# Patient Record
Sex: Female | Born: 1980 | Race: White | Hispanic: No | Marital: Married | Smoking: Current every day smoker
Health system: Southern US, Community
[De-identification: ages and names within clinical notes are randomized; demographics above are authoritative.]

## PROBLEM LIST (undated history)

## (undated) HISTORY — PX: FRACTURE SURGERY: SHX138

---

## 2009-02-28 ENCOUNTER — Emergency Department: Payer: Self-pay | Admitting: Emergency Medicine

## 2009-04-02 ENCOUNTER — Emergency Department: Payer: Self-pay | Admitting: Emergency Medicine

## 2009-09-10 ENCOUNTER — Emergency Department: Payer: Self-pay | Admitting: Emergency Medicine

## 2010-04-08 ENCOUNTER — Emergency Department: Payer: Self-pay | Admitting: Unknown Physician Specialty

## 2010-04-16 ENCOUNTER — Emergency Department: Payer: Self-pay | Admitting: Emergency Medicine

## 2010-06-11 ENCOUNTER — Emergency Department: Payer: Self-pay | Admitting: Unknown Physician Specialty

## 2010-11-16 ENCOUNTER — Emergency Department: Payer: Self-pay | Admitting: Unknown Physician Specialty

## 2012-02-06 IMAGING — CR DG LUMBAR SPINE 2-3V
1 series · 3 of 3 positions shown · non-contrast
Comparison: none

REASON FOR EXAM: back pain
COMMENTS:

PROCEDURE:     DXR - DXR LUMBAR SPINE AP AND LATERAL  - April 08, 2010  [DATE]
RESULT:     No acute bony or joint abnormality. The pedicles are intact.

[Series 1: view not recorded · 0.17mm/px · 3 of 3 slices shown]
[im 1/3]
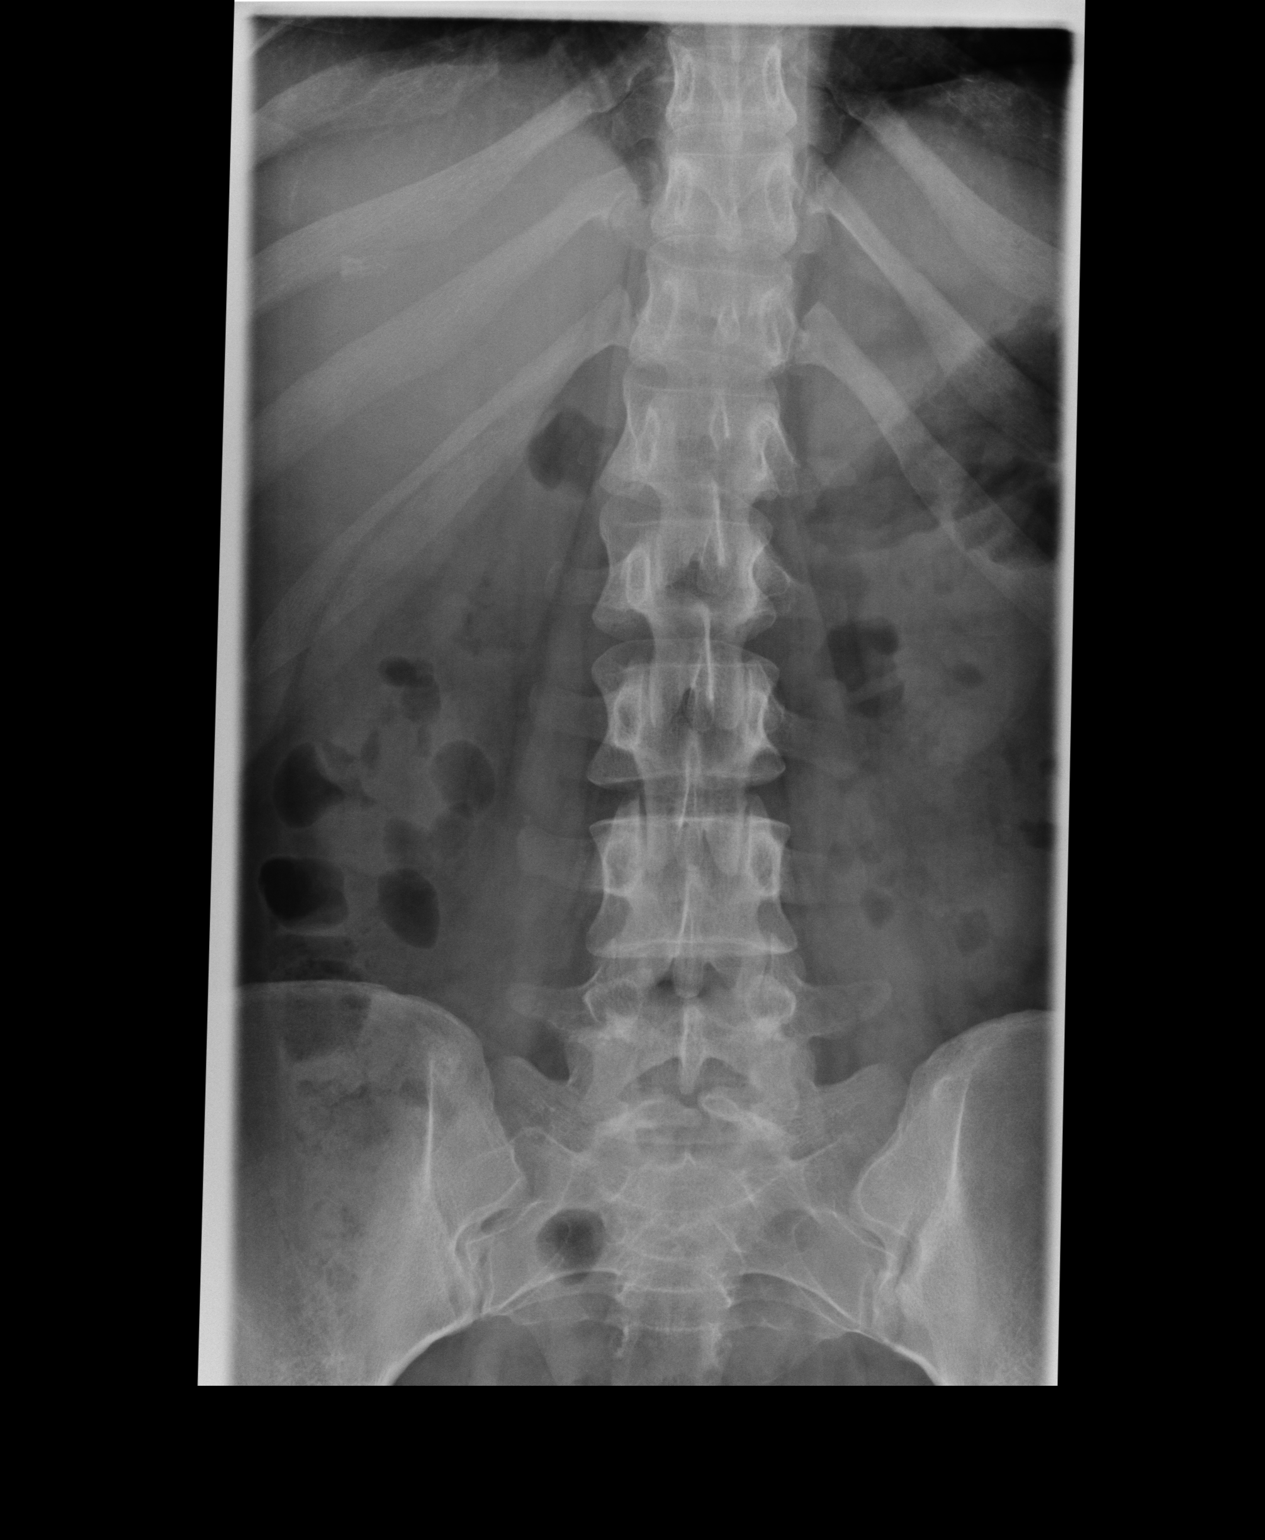
[im 2/3]
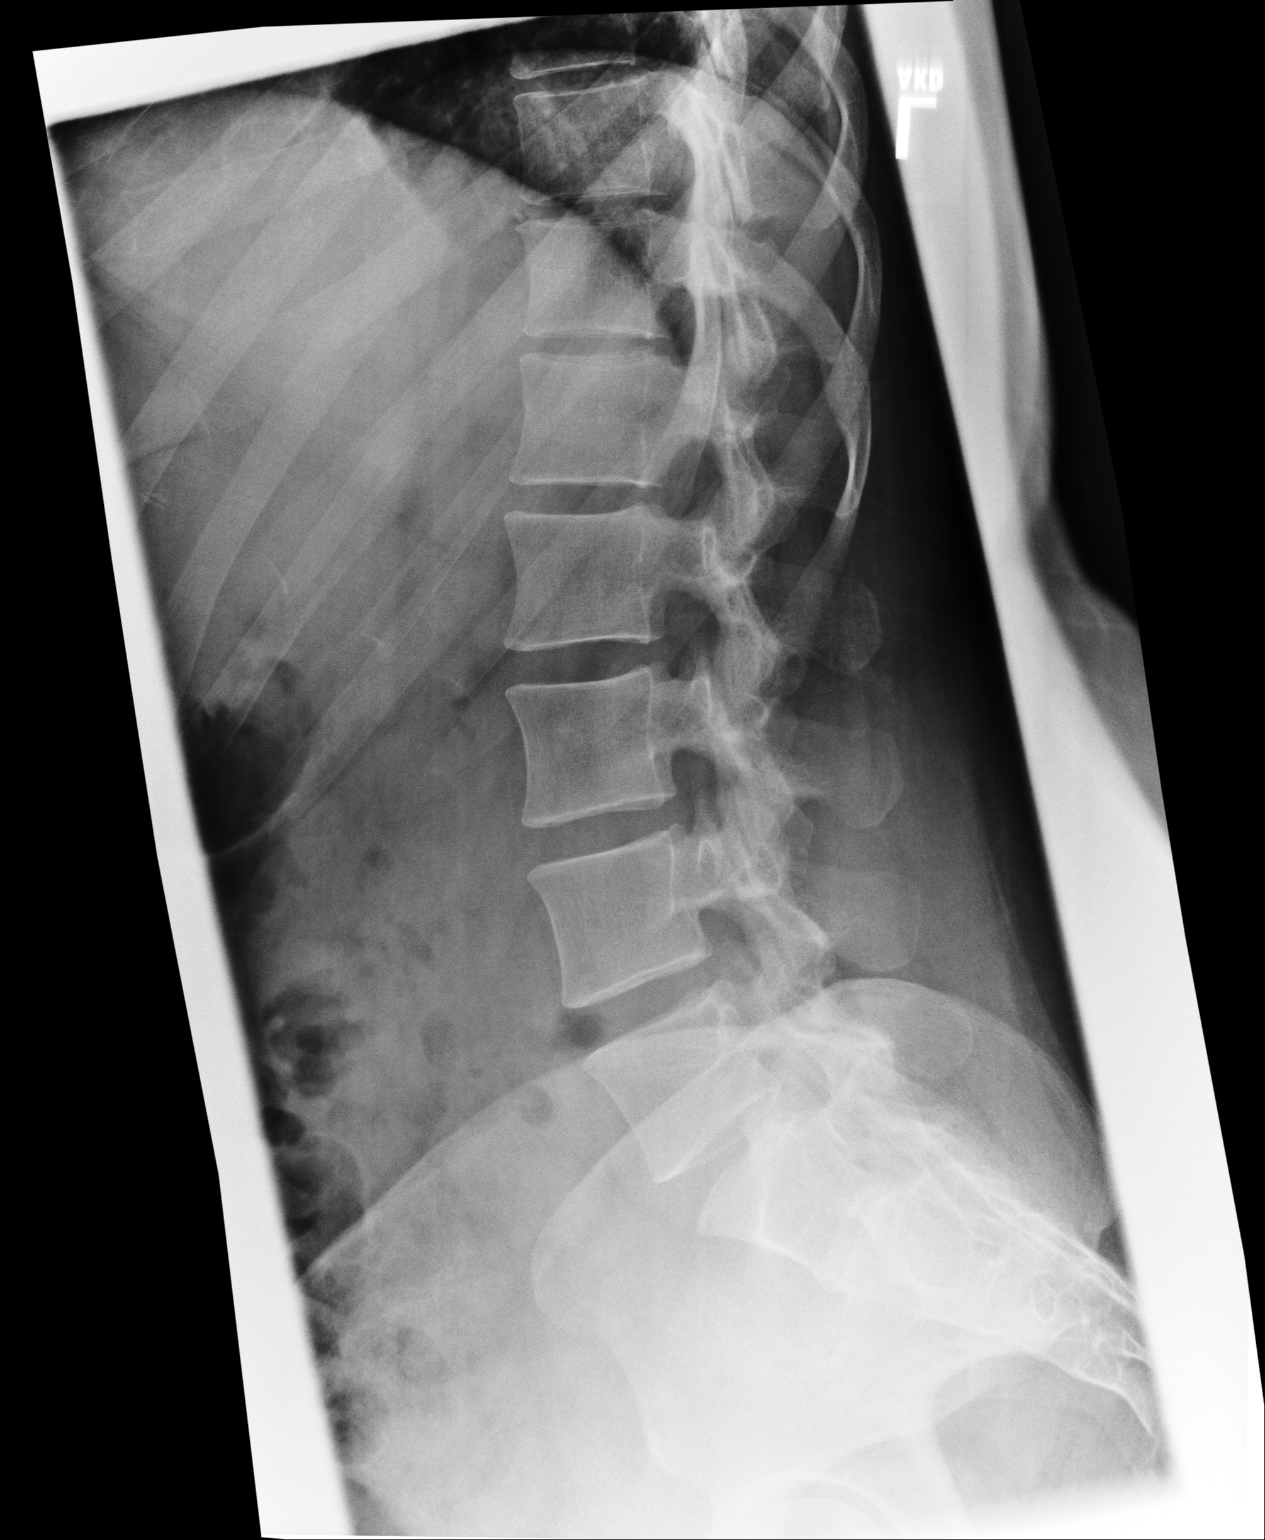
[im 3/3]
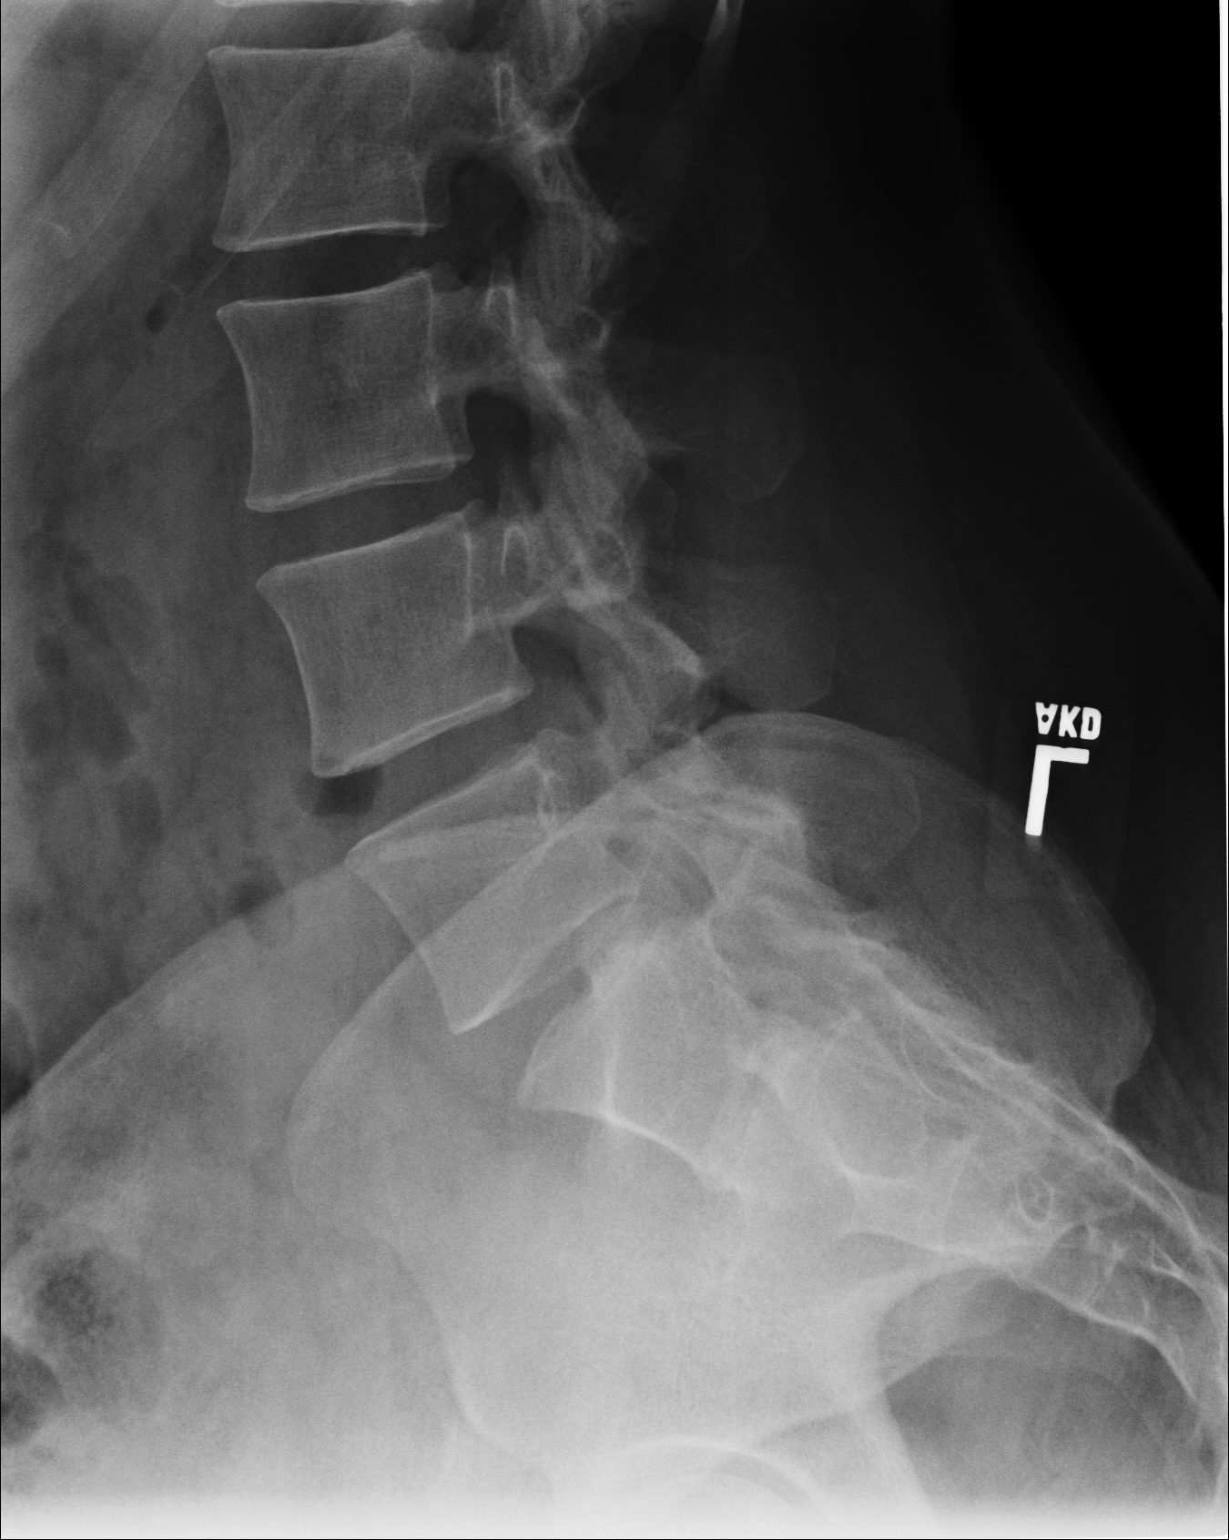

[3 of 3 positions shown; findings below may reference images not displayed]

IMPRESSION: No acute abnormality.
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
## 2016-11-28 ENCOUNTER — Emergency Department
Admission: EM | Admit: 2016-11-28 | Discharge: 2016-11-28 | Disposition: A | Discharge status: Home / Self Care | Payer: Self-pay | Attending: Emergency Medicine | Admitting: Emergency Medicine

## 2016-11-28 ENCOUNTER — Emergency Department: Payer: Self-pay

## 2016-11-28 ENCOUNTER — Encounter: Payer: Self-pay | Admitting: Emergency Medicine

## 2016-11-28 DIAGNOSIS — W230XXA Caught, crushed, jammed, or pinched between moving objects, initial encounter: Secondary | ICD-10-CM | POA: Insufficient documentation

## 2016-11-28 DIAGNOSIS — Y939 Activity, unspecified: Secondary | ICD-10-CM | POA: Insufficient documentation

## 2016-11-28 DIAGNOSIS — S60221A Contusion of right hand, initial encounter: Secondary | ICD-10-CM | POA: Insufficient documentation

## 2016-11-28 DIAGNOSIS — Y929 Unspecified place or not applicable: Secondary | ICD-10-CM | POA: Insufficient documentation

## 2016-11-28 DIAGNOSIS — Y999 Unspecified external cause status: Secondary | ICD-10-CM | POA: Insufficient documentation

## 2016-11-28 MED ORDER — NABUMETONE 750 MG PO TABS: 750.0000 mg | ORAL_TABLET | Freq: Two times a day (BID) | ORAL | 0 refills | Status: AC

## 2016-11-28 MED ORDER — HYDROCODONE-ACETAMINOPHEN 5-325 MG PO TABS: 1.0000 | ORAL_TABLET | Freq: Four times a day (QID) | ORAL | 0 refills | Status: AC | PRN

## 2016-11-28 NOTE — ED Triage Notes (Signed)
Patient states she "slammed my hand in my car door"  Approx. 2 hours ago, accidentally.  Patient's right hand appears bruised and swollen.  Sensation and mobility intact in right fingers.

## 2016-11-28 NOTE — Discharge Instructions (Signed)
Take the prescription meds as directed. Apply ice compresses to promote healing. Follow-up with your provider as needed, [facility].

## 2016-11-28 NOTE — ED Provider Notes (Signed)
[facility] Emergency Department Provider Note ____________________________________________  Time seen: 1706  I have reviewed the triage vital signs and the nursing notes.  HISTORY  Chief Complaint  Hand Pain  HPI Mary Robbins is a 36 y.o.right-handed female who presents to the ED for evaluation of pain to the right hand.  Patient describes she accidentally slammed her right hand into the car door about 2 hours prior to arrival.  She presents now with bruising and swelling over the dorsal aspect of her hand.  She also reports some pain with manipulation.  She denies any other injury at this time, denies any laceration or nailbed injury.  She is dose ibuprofen in the interim without significant benefit.  History reviewed. No pertinent past medical history.  There are no active problems to display for this patient.  History reviewed. No pertinent surgical history.  Prior to Admission medications   Medication Sig Start Date End Date Taking? Authorizing Provider  HYDROcodone-acetaminophen (NORCO) 5-325 MG tablet Take 1 tablet every 6 (six) hours as needed by mouth. 11/28/16   Cyera Balboni, Charlesetta IvoryJenise V Bacon, PA-C  nabumetone (RELAFEN) 750 MG tablet Take 1 tablet (750 mg total) 2 (two) times daily by mouth. 11/28/16   Burech Mcfarland, Charlesetta IvoryJenise V Bacon, PA-C   Allergies Codeine  No family history on file.  Social History Social History   Tobacco Use  . Smoking status: Never Smoker  . Smokeless tobacco: Never Used  Substance Use Topics  . Alcohol use: No    Frequency: Never  . Drug use: No    Review of Systems  Constitutional: Negative for fever. Cardiovascular: Negative for chest pain. Respiratory: Negative for shortness of breath. Gastrointestinal: Negative for abdominal pain, vomiting and diarrhea. Musculoskeletal: Negative for back pain.  Right hand pain as above. Skin: Negative for rash. Neurological: Negative for headaches, focal weakness or  numbness. ____________________________________________  PHYSICAL EXAM:  VITAL SIGNS: ED Triage Vitals  Enc Vitals Group     BP 11/28/16 1612 121/90     Pulse Rate 11/28/16 1612 (!) 110     Resp 11/28/16 1612 16     Temp 11/28/16 1612 98.3 F (36.8 C)     Temp Source 11/28/16 1612 Oral     SpO2 11/28/16 1612 100 %     Weight 11/28/16 1613 187 lb (84.8 kg)     Height 11/28/16 1613 5' 5.5" (1.664 m)     Head Circumference --      Peak Flow --      Pain Score 11/28/16 1611 9     Pain Loc --      Pain Edu? --      Excl. in GC? --     Constitutional: Alert and oriented. Well appearing and in no distress.  Patient able to hold and manipulate her cell phone and both hands (thumbs) upon entering the room. Head: Normocephalic and atraumatic. Neck: Supple. No thyromegaly. Cardiovascular: Normal rate, regular rhythm. Normal distal pulses and cap refill Respiratory: Normal respiratory effort. No wheezes/rales/rhonchi. Musculoskeletal: Right hand without any obvious deformity, dislocation, or laceration.  She is noted to have some dorsal soft tissue swelling with superficial hematoma noted over the dorsal second and third MCPs.  Normal composite fist on exam.  Nontender with normal range of motion in all extremities.  Neurologic: Cranial nerves II through XII grossly intact.  Normal gross sensation.  Normal oppotition and intrinsic testing.  Normal gait without ataxia. Normal speech and language. No gross focal neurologic deficits are appreciated.  Skin:  Skin is warm, dry and intact. No rash noted. Intact as noted above. ____________________________________________   RADIOLOGY  Right Hand  IMPRESSION: No bone abnormality.  Soft tissue swelling as described  I, Estelle Skibicki, Charlesetta IvoryJenise V Bacon, personally viewed and evaluated these images (plain radiographs) as part of my medical decision making, as well as reviewing the written report by the  radiologist. ____________________________________________  INITIAL IMPRESSION / ASSESSMENT AND PLAN / ED COURSE  Patient with ED evaluation of injury to the right hand after an accidental, self-inflicted crush injury.  Patient excellently slammed her hand in the car door.  She presents now with pain, swelling, and focal hematoma to the dorsal aspect of the hand.  Her x-ray is negative for any acute fracture or dislocation.  Patient with normal composite fist and normal hand exam.  She has declined a Ace bandage or wrist cock-up splint at this time.  She is discharged with prescription for Relafen and #10 hydrocodone for breakthrough pain.  She will follow-up with medicine urgent care for ongoing symptoms.  A work note is provided for 1 day as requested. ____________________________________________  FINAL CLINICAL IMPRESSION(S) / ED DIAGNOSES  Final diagnoses:  Contusion of right hand, initial encounter      Lissa HoardMenshew, Savvy Peeters V Bacon, PA-C 11/28/16 1755    Sharman CheekStafford, Phillip, MD 11/28/16 2106

## 2017-05-17 ENCOUNTER — Emergency Department
Admission: EM | Admit: 2017-05-17 | Discharge: 2017-05-17 | Disposition: A | Discharge status: Nursing Home (Includes ICF) | Payer: Self-pay | Attending: Emergency Medicine | Admitting: Emergency Medicine

## 2017-05-17 ENCOUNTER — Other Ambulatory Visit: Payer: Self-pay

## 2017-05-17 ENCOUNTER — Emergency Department: Payer: Self-pay

## 2017-05-17 DIAGNOSIS — Y92009 Unspecified place in unspecified non-institutional (private) residence as the place of occurrence of the external cause: Secondary | ICD-10-CM | POA: Insufficient documentation

## 2017-05-17 DIAGNOSIS — Z79899 Other long term (current) drug therapy: Secondary | ICD-10-CM | POA: Insufficient documentation

## 2017-05-17 DIAGNOSIS — S0230XA Fracture of orbital floor, unspecified side, initial encounter for closed fracture: Secondary | ICD-10-CM

## 2017-05-17 DIAGNOSIS — S0232XA Fracture of orbital floor, left side, initial encounter for closed fracture: Secondary | ICD-10-CM | POA: Insufficient documentation

## 2017-05-17 DIAGNOSIS — S0990XA Unspecified injury of head, initial encounter: Secondary | ICD-10-CM

## 2017-05-17 DIAGNOSIS — Y9389 Activity, other specified: Secondary | ICD-10-CM | POA: Insufficient documentation

## 2017-05-17 DIAGNOSIS — Y999 Unspecified external cause status: Secondary | ICD-10-CM | POA: Insufficient documentation

## 2017-05-17 MED ORDER — ACETAMINOPHEN 500 MG PO TABS
1000.0000 mg | ORAL_TABLET | Freq: Once | ORAL | Status: AC
  Administered 2017-05-17: 1000 mg via ORAL
  Filled 2017-05-17: qty 2

## 2017-05-17 MED ORDER — ONDANSETRON 4 MG PO TBDP
4.0000 mg | ORAL_TABLET | Freq: Once | ORAL | Status: AC | PRN
  Administered 2017-05-17: 4 mg via ORAL
  Filled 2017-05-17: qty 1
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
## 2017-05-17 NOTE — ED Notes (Signed)
Pt offered a percocet but denies wanting to take a pain pill at this time. Was offered zofran for nausea and states she will take that. States she took ibuprofen earlier.
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
## 2017-05-17 NOTE — ED Notes (Signed)
emtala reviewed by this RN 
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
## 2017-05-17 NOTE — ED Notes (Signed)
Informed Dr. Mayford Knife of pt presentation, verbal orders received for CT scans and pain medicine.

## 2017-05-17 NOTE — ED Notes (Addendum)
Dr. Brooke Dare in room, using portable slit lamp.
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
## 2017-05-17 NOTE — ED Notes (Signed)
Pt took small sip of water with Tylenol

## 2017-05-17 NOTE — ED Notes (Signed)
Pt on the phone, tearful at this time, Respirations equal and unlabored at this time.
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
## 2017-11-17 ENCOUNTER — Emergency Department
Admission: EM | Admit: 2017-11-17 | Discharge: 2017-11-17 | Disposition: A | Discharge status: Home / Self Care | Payer: Self-pay | Attending: Emergency Medicine | Admitting: Emergency Medicine

## 2017-11-17 ENCOUNTER — Other Ambulatory Visit: Payer: Self-pay

## 2017-11-17 ENCOUNTER — Encounter: Payer: Self-pay | Admitting: Emergency Medicine

## 2017-11-17 DIAGNOSIS — R079 Chest pain, unspecified: Secondary | ICD-10-CM | POA: Insufficient documentation

## 2017-11-17 DIAGNOSIS — R51 Headache: Secondary | ICD-10-CM | POA: Insufficient documentation

## 2017-11-17 DIAGNOSIS — R109 Unspecified abdominal pain: Secondary | ICD-10-CM | POA: Insufficient documentation

## 2017-11-17 DIAGNOSIS — R112 Nausea with vomiting, unspecified: Secondary | ICD-10-CM | POA: Insufficient documentation

## 2017-11-17 LAB — COMPREHENSIVE METABOLIC PANEL
ALT: 20 U/L (ref 0–44)
AST: 21 U/L (ref 15–41)
Albumin: 4.6 g/dL (ref 3.5–5.0)
Alkaline Phosphatase: 84 U/L (ref 38–126)
Anion gap: 9 (ref 5–15)
BUN: 6 mg/dL (ref 6–20)
CHLORIDE: 103 mmol/L (ref 98–111)
CO2: 29 mmol/L (ref 22–32)
CREATININE: 0.65 mg/dL (ref 0.44–1.00)
Calcium: 9.4 mg/dL (ref 8.9–10.3)
Glucose, Bld: 83 mg/dL (ref 70–99)
Potassium: 3.9 mmol/L (ref 3.5–5.1)
Sodium: 141 mmol/L (ref 135–145)
TOTAL PROTEIN: 7.6 g/dL (ref 6.5–8.1)
Total Bilirubin: 0.7 mg/dL (ref 0.3–1.2)

## 2017-11-17 LAB — CBC
HCT: 44.8 % (ref 36.0–46.0)
Hemoglobin: 14.5 g/dL (ref 12.0–15.0)
MCH: 28.9 pg (ref 26.0–34.0)
MCHC: 32.4 g/dL (ref 30.0–36.0)
MCV: 89.4 fL (ref 80.0–100.0)
PLATELETS: 355 10*3/uL (ref 150–400)
RBC: 5.01 MIL/uL (ref 3.87–5.11)
RDW: 13.3 % (ref 11.5–15.5)
WBC: 12.3 10*3/uL — ABNORMAL HIGH (ref 4.0–10.5)
nRBC: 0 % (ref 0.0–0.2)

## 2017-11-17 LAB — TROPONIN I: Troponin I: <0.03 ng/mL (ref <0.03)

## 2017-11-17 LAB — LIPASE, BLOOD: LIPASE: 30 U/L (ref 11–51)

## 2017-11-17 MED ORDER — ONDANSETRON HCL 4 MG/2ML IJ SOLN: 4.0000 mg | Freq: Once | INTRAMUSCULAR | Status: DC

## 2017-11-17 MED ORDER — ONDANSETRON 4 MG PO TBDP: 4.0000 mg | ORAL_TABLET | Freq: Three times a day (TID) | ORAL | 0 refills | Status: AC | PRN

## 2017-11-17 MED ORDER — METOCLOPRAMIDE HCL 5 MG/ML IJ SOLN: 10.0000 mg | Freq: Once | INTRAMUSCULAR | Status: DC

## 2017-11-17 MED ORDER — SODIUM CHLORIDE 0.9 % IV SOLN: Freq: Once | INTRAVENOUS | Status: DC

## 2017-11-17 MED ORDER — KETOROLAC TROMETHAMINE 30 MG/ML IJ SOLN
60.0000 mg | Freq: Once | INTRAMUSCULAR | Status: DC
Start: 2017-11-17 — End: 2017-11-17
  Filled 2017-11-17: qty 2

## 2017-11-17 MED ORDER — KETOROLAC TROMETHAMINE 30 MG/ML IJ SOLN: 30.0000 mg | Freq: Once | INTRAMUSCULAR | Status: DC

## 2017-11-17 MED ORDER — METOCLOPRAMIDE HCL 10 MG PO TABS
10.0000 mg | ORAL_TABLET | Freq: Once | ORAL | Status: AC
  Administered 2017-11-17: 10 mg via ORAL
  Filled 2017-11-17: qty 1

## 2017-11-17 NOTE — ED Notes (Signed)
PT STATES SHE DOES NOT WANT VISITORS TO BE TOLD WHERE SHE IS UNLESS SHE GIVES VERBAL PERMISSION. PT DOES NOT WANT TO BE A XXX

## 2017-11-17 NOTE — ED Notes (Signed)
Pt attempted to urinate but was unsuccessful.  

## 2017-11-17 NOTE — ED Triage Notes (Signed)
Pt states on Sunday she started to feel nauseated. Pt also reports headache and abdominal pain. Pt reports last episode of emesis yesterday am. PT states her abdomen feels sore.   Pt states she also has chest pain but refuses EKG in triage. She states "I know it's not my heart. When I get sick like this it just hurts from throwing up." Pt explained the benefits and risks of not performing EKG. PT still refuses. PT does agree to troponin to be drawn with her abdominal pain blood work.

## 2017-11-17 NOTE — ED Notes (Signed)
Pt alert and oriented X4, active, cooperative, pt in NAD. RR even and unlabored, color WNL.  Pt informed to return if any life threatening symptoms occur.  Discharge and followup instructions reviewed. Ambulates safely. 

## 2017-11-17 NOTE — ED Notes (Signed)
Unable to provide urine sample
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
## 2018-02-17 ENCOUNTER — Emergency Department
Admission: EM | Admit: 2018-02-17 | Discharge: 2018-02-17 | Disposition: A | Discharge status: Home / Self Care | Payer: Self-pay | Attending: Emergency Medicine | Admitting: Emergency Medicine

## 2018-02-17 ENCOUNTER — Encounter: Payer: Self-pay | Admitting: Emergency Medicine

## 2018-02-17 DIAGNOSIS — J3489 Other specified disorders of nose and nasal sinuses: Secondary | ICD-10-CM | POA: Insufficient documentation

## 2018-02-17 DIAGNOSIS — R51 Headache: Secondary | ICD-10-CM

## 2018-02-17 DIAGNOSIS — R519 Headache, unspecified: Secondary | ICD-10-CM

## 2018-02-17 MED ORDER — AMOXICILLIN-POT CLAVULANATE 875-125 MG PO TABS: 1.0000 | ORAL_TABLET | Freq: Two times a day (BID) | ORAL | 0 refills | Status: AC
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
## 2018-02-17 NOTE — Discharge Instructions (Addendum)
Please make an appointment with your maxillofacial surgeon at [facility] and the ENT specialist.  Please take the entire course of antibiotics, even if you are feeling better.  You may continue to take your Synex medicine for 3 to 5 days, then please discontinue this medication.  Do not blow your nose, put anything in your nose including her finger, until you have been seen by the ENT doctor and the surgeon.  You may take Tylenol and Motrin for your pain.  Return to the emergency department if you develop severe pain, lightheadedness or syncope, fever, vomiting, or any other symptoms concerning to you.

## 2018-02-17 NOTE — ED Notes (Signed)
AAOx3.  Skin warm and dry.  NAD 

## 2018-02-17 NOTE — ED Triage Notes (Signed)
Pt reports sinus pressure and pain and feeling hot for a couple of weeks. Pt states has also been really tired.

## 2018-07-03 ENCOUNTER — Emergency Department
Admission: EM | Admit: 2018-07-03 | Discharge: 2018-07-03 | Disposition: A | Discharge status: Home / Self Care | Payer: Self-pay | Attending: Emergency Medicine | Admitting: Emergency Medicine

## 2018-07-03 ENCOUNTER — Other Ambulatory Visit: Payer: Self-pay

## 2018-07-03 DIAGNOSIS — F17210 Nicotine dependence, cigarettes, uncomplicated: Secondary | ICD-10-CM | POA: Insufficient documentation

## 2018-07-03 DIAGNOSIS — Y9289 Other specified places as the place of occurrence of the external cause: Secondary | ICD-10-CM | POA: Insufficient documentation

## 2018-07-03 DIAGNOSIS — Y998 Other external cause status: Secondary | ICD-10-CM | POA: Insufficient documentation

## 2018-07-03 DIAGNOSIS — W25XXXA Contact with sharp glass, initial encounter: Secondary | ICD-10-CM | POA: Insufficient documentation

## 2018-07-03 DIAGNOSIS — S61011A Laceration without foreign body of right thumb without damage to nail, initial encounter: Secondary | ICD-10-CM | POA: Insufficient documentation

## 2018-07-03 DIAGNOSIS — Y9389 Activity, other specified: Secondary | ICD-10-CM | POA: Insufficient documentation

## 2018-07-03 MED ORDER — LIDOCAINE HCL (PF) 1 % IJ SOLN
INTRAMUSCULAR | Status: AC
  Administered 2018-07-03: 5 mL
  Filled 2018-07-03: qty 5

## 2018-07-03 MED ORDER — CEPHALEXIN 500 MG PO CAPS: 500.0000 mg | ORAL_CAPSULE | Freq: Three times a day (TID) | ORAL | 0 refills | Status: AC

## 2018-07-03 MED ORDER — LIDOCAINE HCL 1 % IJ SOLN
5.0000 mL | Freq: Once | INTRAMUSCULAR | Status: AC
  Administered 2018-07-03: 5 mL

## 2018-07-03 NOTE — ED Notes (Signed)
Pt assessed by EDP, reports cutting finger while cleaning glass, bleeding controled, CMS intact

## 2018-07-03 NOTE — ED Triage Notes (Signed)
Patient c/o 1/4" laceration to firth digit, and circumferential laceration to 1st digit, right hand; patient cut hands while washing dishes.

## 2018-07-03 NOTE — ED Notes (Signed)
No peripheral IV placed this visit.   Discharge instructions reviewed with patient. Questions fielded by this RN. Patient verbalizes understanding of instructions. Patient discharged home in stable condition per Jackie, PA. No acute distress noted at time of discharge.   

## 2018-07-04 NOTE — ED Provider Notes (Signed)
[facility] Emergency Department Provider Note  ____________________________________________  Time seen: Approximately 12:38 AM  I have reviewed the triage vital signs and the nursing notes.   HISTORY  Chief Complaint Laceration    HPI Mary Robbins is a 38 y.o. female presents to the emergency department with a 2 cm laceration along the dorsal aspect of the right thumb that patient sustained accidentally while cleaning a glass earlier tonight.  No numbness or tingling of the right hand.  Patient has been actively moving digit since injury occurred.  Tetanus status is up-to-date.  No other alleviating measures have been attempted.         History reviewed. No pertinent past medical history.  There are no active problems to display for this patient.   Past Surgical History:  Procedure Laterality Date  . CESAREAN SECTION    . FRACTURE SURGERY      Prior to Admission medications   Medication Sig Start Date End Date Taking? Authorizing Provider  cephALEXin (KEFLEX) 500 MG capsule Take 1 capsule (500 mg total) by mouth 3 (three) times daily for 7 days. 07/03/18 07/10/18  Lannie Fields, PA-C  HYDROcodone-acetaminophen (NORCO) 5-325 MG tablet Take 1 tablet every 6 (six) hours as needed by mouth. 11/28/16   Menshew, Dannielle Karvonen, PA-C  nabumetone (RELAFEN) 750 MG tablet Take 1 tablet (750 mg total) 2 (two) times daily by mouth. 11/28/16   Menshew, Dannielle Karvonen, PA-C  ondansetron (ZOFRAN ODT) 4 MG disintegrating tablet Take 1 tablet (4 mg total) by mouth every 8 (eight) hours as needed for nausea or vomiting. 11/17/17   Earleen Newport, MD    Allergies Codeine  No family history on file.  Social History Social History   Tobacco Use  . Smoking status: Current Every Day Smoker    Types: E-cigarettes  . Smokeless tobacco: Never Used  Substance Use Topics  . Alcohol use: No    Frequency: Never  . Drug use: No     Review of Systems   Constitutional: No fever/chills Eyes: No visual changes. No discharge ENT: No upper respiratory complaints. Cardiovascular: no chest pain. Respiratory: no cough. No SOB. Gastrointestinal: No abdominal pain.  No nausea, no vomiting.  No diarrhea.  No constipation. Musculoskeletal: Negative for musculoskeletal pain. Skin: Patient has right thumb laceration.  Neurological: Negative for headaches, focal weakness or numbness.  ____________________________________________   PHYSICAL EXAM:  VITAL SIGNS: ED Triage Vitals [07/03/18 2217]  Enc Vitals Group     BP (!) 147/107     Pulse Rate 96     Resp 18     Temp 98.5 F (36.9 C)     Temp src      SpO2 97 %     Weight 176 lb 5.9 oz (80 kg)     Height 5' 5.5" (1.664 m)     Head Circumference      Peak Flow      Pain Score 2     Pain Loc      Pain Edu?      Excl. in [place]?      Constitutional: Alert and oriented. Well appearing and in no acute distress. Eyes: Conjunctivae are normal. PERRL. EOMI. Head: Atraumatic. Respiratory: Normal respiratory effort without tachypnea or retractions. Lungs CTAB. Good air entry to the bases with no decreased or absent breath sounds. Gastrointestinal: Bowel sounds 4 quadrants. Soft and nontender to palpation. No guarding or rigidity. No palpable masses. No distention. No CVA tenderness.  Musculoskeletal: Full range of motion to all extremities. No gross deformities appreciated. Neurologic:  Normal speech and language. No gross focal neurologic deficits are appreciated.  Skin: Patient has 2 cm dorsal laceration at right thumb. Psychiatric: Mood and affect are normal. Speech and behavior are normal. Patient exhibits appropriate insight and judgement.   ____________________________________________   LABS (all labs ordered are listed, but only abnormal results are displayed)  Labs Reviewed - No data to  display ____________________________________________  EKG   ____________________________________________  RADIOLOGY   No results found.  ____________________________________________    PROCEDURES  Procedure(s) performed:    Procedures  LACERATION REPAIR Performed by: Orvil FeilJaclyn M Mack Thurmon Authorized by: Orvil FeilJaclyn M Hadassa Cermak Consent: Verbal consent obtained. Risks and benefits: risks, benefits and alternatives were discussed Consent given by: patient Patient identity confirmed: provided demographic data Prepped and Draped in normal sterile fashion Wound explored  Laceration Location: Dorsal aspect of right thumb   Laceration Length: 2 cm  No Foreign Bodies seen or palpated  Anesthesia: local infiltration  Local anesthetic: lidocaine 1% without epinephrine  Anesthetic total: 2 ml  Irrigation method: syringe Amount of cleaning: standard  Skin closure: 5-0 Ethilon   Number of sutures: 4  Technique: Simple Interrupted   Patient tolerance: Patient tolerated the procedure well with no immediate complications.    Medications  lidocaine (XYLOCAINE) 1 % (with pres) injection 5 mL (5 mLs Infiltration Given by Other 07/03/18 2314)     ____________________________________________   INITIAL IMPRESSION / ASSESSMENT AND PLAN / ED COURSE  Pertinent labs & imaging results that were available during my care of the patient were reviewed by me and considered in my medical decision making (see chart for details).  Review of the [place] CSRS was performed in accordance of the [facility] prior to dispensing any controlled drugs.           Assessment and plan Laceration Patient presents to the emergency department with a laceration sustained to the dorsal aspect of the right thumb.  Laceration was repaired in the emergency department without complication.  Patient was advised have sutures removed by primary care in 7 days.  She was discharged with Keflex.  All patient questions were  answered.     ____________________________________________  FINAL CLINICAL IMPRESSION(S) / ED DIAGNOSES  Final diagnoses:  Laceration of right thumb without foreign body without damage to nail, initial encounter      NEW MEDICATIONS STARTED DURING THIS VISIT:  ED Discharge Orders         Ordered    cephALEXin (KEFLEX) 500 MG capsule  3 times daily     07/03/18 2317              This chart was dictated using voice recognition software/Dragon. Despite best efforts to proofread, errors can occur which can change the meaning. Any change was purely unintentional.    Orvil FeilWoods, Bonniejean Piano M, PA-C 07/04/18 0040    Minna AntisPaduchowski, Kevin, MD 07/04/18 1536

## 2019-02-03 ENCOUNTER — Emergency Department
Admission: EM | Admit: 2019-02-03 | Discharge: 2019-02-03 | Disposition: A | Discharge status: Home / Self Care | Payer: HRSA Program | Attending: Emergency Medicine | Admitting: Emergency Medicine

## 2019-02-03 ENCOUNTER — Encounter: Payer: Self-pay | Admitting: Emergency Medicine

## 2019-02-03 ENCOUNTER — Other Ambulatory Visit: Payer: Self-pay

## 2019-02-03 DIAGNOSIS — F17210 Nicotine dependence, cigarettes, uncomplicated: Secondary | ICD-10-CM | POA: Insufficient documentation

## 2019-02-03 DIAGNOSIS — Z20822 Contact with and (suspected) exposure to covid-19: Secondary | ICD-10-CM | POA: Diagnosis not present

## 2019-02-03 DIAGNOSIS — J069 Acute upper respiratory infection, unspecified: Secondary | ICD-10-CM | POA: Insufficient documentation

## 2019-02-03 DIAGNOSIS — J029 Acute pharyngitis, unspecified: Secondary | ICD-10-CM | POA: Diagnosis present

## 2019-02-03 LAB — GROUP A STREP BY PCR: Group A Strep by PCR: NOT DETECTED

## 2019-02-03 MED ORDER — ALBUTEROL SULFATE HFA 108 (90 BASE) MCG/ACT IN AERS: 2.0000 | INHALATION_SPRAY | Freq: Four times a day (QID) | RESPIRATORY_TRACT | 0 refills | Status: AC | PRN

## 2019-02-03 MED ORDER — ONDANSETRON HCL 4 MG PO TABS: 4.0000 mg | ORAL_TABLET | Freq: Three times a day (TID) | ORAL | 0 refills | Status: AC | PRN

## 2019-02-03 MED ORDER — DICYCLOMINE HCL 10 MG PO CAPS: 10.0000 mg | ORAL_CAPSULE | Freq: Four times a day (QID) | ORAL | 0 refills | Status: AC

## 2019-02-03 NOTE — ED Provider Notes (Signed)
Emergency Department Provider Note  ____________________________________________  Time seen: Approximately 9:04 PM  I have reviewed the triage vital signs and the nursing notes.   HISTORY  Chief Complaint Sore Throat   Historian Patient     HPI Mary Robbins is a 39 y.o. female presents to the emergency department with headache and pharyngitis.  She reports that she has had symptoms for the past 24 hours.  Patient states that her husband has recently tested positive for COVID-19.  She denies chest pain, chest tightness, shortness of breath or abdominal pain.  No other alleviating measures have been attempted.    History reviewed. No pertinent past medical history.   Immunizations up to date:  Yes.     History reviewed. No pertinent past medical history.  There are no problems to display for this patient.   Past Surgical History:  Procedure Laterality Date  . CESAREAN SECTION    . FRACTURE SURGERY      Prior to Admission medications   Medication Sig Start Date End Date Taking? Authorizing Provider  HYDROcodone-acetaminophen (NORCO) 5-325 MG tablet Take 1 tablet every 6 (six) hours as needed by mouth. 11/28/16   Menshew, Charlesetta Ivory, PA-C  nabumetone (RELAFEN) 750 MG tablet Take 1 tablet (750 mg total) 2 (two) times daily by mouth. 11/28/16   Menshew, Charlesetta Ivory, PA-C  ondansetron (ZOFRAN ODT) 4 MG disintegrating tablet Take 1 tablet (4 mg total) by mouth every 8 (eight) hours as needed for nausea or vomiting. 11/17/17   Emily Filbert, MD    Allergies Codeine  No family history on file.  Social History Social History   Tobacco Use  . Smoking status: Current Every Day Smoker    Types: E-cigarettes  . Smokeless tobacco: Never Used  Substance Use Topics  . Alcohol use: No  . Drug use: No      Review of Systems  Constitutional: Patient has been afebrile.  Eyes: No visual changes. No discharge ENT: Patient has congestion.   Cardiovascular: no chest pain. Respiratory: Patient has cough.  Gastrointestinal: No abdominal pain.  No nausea, no vomiting. Patient had diarrhea.  Genitourinary: Negative for dysuria. No hematuria Musculoskeletal: Patient has myalgias.  Skin: Negative for rash, abrasions, lacerations, ecchymosis. Neurological: Patient has headache, no focal weakness or numbness.      ____________________________________________   PHYSICAL EXAM:  VITAL SIGNS: ED Triage Vitals  Enc Vitals Group     BP 02/03/19 1814 132/83     Pulse Rate 02/03/19 1814 98     Resp 02/03/19 1814 18     Temp 02/03/19 1814 97.8 F (36.6 C)     Temp Source 02/03/19 1814 Oral     SpO2 02/03/19 1814 100 %     Weight 02/03/19 1815 170 lb (77.1 kg)     Height 02/03/19 1815 5' 5.5" (1.664 m)     Head Circumference --      Peak Flow --      Pain Score 02/03/19 1814 3     Pain Loc --      Pain Edu? --      Excl. in [place]? --      Constitutional: Alert and oriented. Patient is lying supine. Eyes: Conjunctivae are normal. PERRL. EOMI. Head: Atraumatic. ENT:      Ears: Tympanic membranes are mildly injected with mild effusion bilaterally.       Nose: No congestion/rhinnorhea.      Mouth/Throat: Mucous membranes are moist. Posterior pharynx is mildly erythematous.  Hematological/Lymphatic/Immunilogical: No cervical lymphadenopathy.  Cardiovascular: Normal rate, regular rhythm. Normal S1 and S2.  Good peripheral circulation. Respiratory: Normal respiratory effort without tachypnea or retractions. Lungs CTAB. Good air entry to the bases with no decreased or absent breath sounds. Gastrointestinal: Bowel sounds 4 quadrants. Soft and nontender to palpation. No guarding or rigidity. No palpable masses. No distention. No CVA tenderness. Musculoskeletal: Full range of motion to all extremities. No gross deformities appreciated. Neurologic:  Normal speech and language. No gross focal neurologic deficits are appreciated.   Skin:  Skin is warm, dry and intact. No rash noted. Psychiatric: Mood and affect are normal. Speech and behavior are normal. Patient exhibits appropriate insight and judgement.   ____________________________________________   LABS (all labs ordered are listed, but only abnormal results are displayed)  Labs Reviewed  GROUP A STREP BY PCR  SARS CORONAVIRUS 2 (TAT 6-24 HRS)   ____________________________________________  EKG   ____________________________________________  RADIOLOGY  No results found.  ____________________________________________    PROCEDURES  Procedure(s) performed:     Procedures     Medications - No data to display   ____________________________________________   INITIAL IMPRESSION / ASSESSMENT AND PLAN / ED COURSE  Pertinent labs & imaging results that were available during my care of the patient were reviewed by me and considered in my medical decision making (see chart for details).      Assessment and Plan: Viral URI 39 year old female presents to the emergency department with headache and pharyngitis for the past 1 to 2 days.  Vital signs were reassuring at triage.  COVID-19 testing is in process at this time.  Tylenol and ibuprofen alternating were recommended for fever.  Return precautions were given.  All patient questions were answered.    ____________________________________________  FINAL CLINICAL IMPRESSION(S) / ED DIAGNOSES  Final diagnoses:  Viral URI      NEW MEDICATIONS STARTED DURING THIS VISIT:  ED Discharge Orders    None          This chart was dictated using voice recognition software/Dragon. Despite best efforts to proofread, errors can occur which can change the meaning. Any change was purely unintentional.     Karren Cobble 02/03/19 2108    Blake Divine, MD 02/07/19 2013

## 2019-02-03 NOTE — ED Notes (Signed)
Pt states her room mate is positive for covid and her symptoms started 4 days ago approx when the room mates started.

## 2019-02-03 NOTE — ED Triage Notes (Signed)
Patient presents to the ED with headache and sore throat at this time.  Patient states someone in her household has covid19.  She found out 2 days ago.  Patient reports feeling anxious.  Patient is afebrile, denies loss of taste and smell.

## 2019-02-04 LAB — SARS CORONAVIRUS 2 (TAT 6-24 HRS): SARS Coronavirus 2: NEGATIVE

## 2019-05-11 ENCOUNTER — Other Ambulatory Visit: Payer: Self-pay

## 2019-05-11 ENCOUNTER — Encounter: Payer: Self-pay | Admitting: Emergency Medicine

## 2019-05-11 DIAGNOSIS — R1110 Vomiting, unspecified: Secondary | ICD-10-CM | POA: Insufficient documentation

## 2019-05-11 DIAGNOSIS — Z5321 Procedure and treatment not carried out due to patient leaving prior to being seen by health care provider: Secondary | ICD-10-CM | POA: Insufficient documentation

## 2019-05-11 NOTE — ED Triage Notes (Signed)
Pt to triage via w/c with no distress noted, mask in place; Pt reports N/V tonight; denies abd pain; also reports recent nonprod cough

## 2019-05-11 NOTE — ED Notes (Signed)
Pt in with cough for few days co chest soreness. Has taken otc meds, vomiting today EMS gave 4 mg zofran, 20g RAC started prior to arrival. CBG 131 per EMS.

## 2019-05-12 ENCOUNTER — Emergency Department
Admission: EM | Admit: 2019-05-12 | Discharge: 2019-05-12 | Disposition: A | Discharge status: Home / Self Care | Payer: Medicaid Other | Attending: Emergency Medicine | Admitting: Emergency Medicine

## 2019-05-12 LAB — COMPREHENSIVE METABOLIC PANEL
ALT: 43 U/L (ref 0–44)
AST: 31 U/L (ref 15–41)
Albumin: 3.9 g/dL (ref 3.5–5.0)
Alkaline Phosphatase: 112 U/L (ref 38–126)
Anion gap: 8 (ref 5–15)
BUN: 11 mg/dL (ref 6–20)
CO2: 25 mmol/L (ref 22–32)
Calcium: 9.1 mg/dL (ref 8.9–10.3)
Chloride: 108 mmol/L (ref 98–111)
Creatinine, Ser: 0.91 mg/dL (ref 0.44–1.00)
GFR calc Af Amer: >60 mL/min (ref >60)
GFR calc non Af Amer: >60 mL/min (ref >60)
Glucose, Bld: 109 mg/dL — ABNORMAL HIGH (ref 70–99)
Potassium: 4.2 mmol/L (ref 3.5–5.1)
Sodium: 141 mmol/L (ref 135–145)
Total Bilirubin: 0.9 mg/dL (ref 0.3–1.2)
Total Protein: 6.6 g/dL (ref 6.5–8.1)

## 2019-05-12 LAB — URINALYSIS, COMPLETE (UACMP) WITH MICROSCOPIC
Bilirubin Urine: NEGATIVE
Glucose, UA: NEGATIVE mg/dL
Hgb urine dipstick: NEGATIVE
Ketones, ur: 5 mg/dL — AB
Nitrite: POSITIVE — AB
Protein, ur: 100 mg/dL — AB
Specific Gravity, Urine: 1.035 — ABNORMAL HIGH (ref 1.005–1.030)
pH: 5 (ref 5.0–8.0)

## 2019-05-12 LAB — CBC WITH DIFFERENTIAL/PLATELET
Abs Immature Granulocytes: 0.04 10*3/uL (ref 0.00–0.07)
Basophils Absolute: 0.1 10*3/uL (ref 0.0–0.1)
Basophils Relative: 1 %
Eosinophils Absolute: 0.2 10*3/uL (ref 0.0–0.5)
Eosinophils Relative: 2 %
HCT: 39.3 % (ref 36.0–46.0)
Hemoglobin: 12.3 g/dL (ref 12.0–15.0)
Immature Granulocytes: 0 %
Lymphocytes Relative: 25 %
Lymphs Abs: 2.6 10*3/uL (ref 0.7–4.0)
MCH: 28.5 pg (ref 26.0–34.0)
MCHC: 31.3 g/dL (ref 30.0–36.0)
MCV: 91 fL (ref 80.0–100.0)
Monocytes Absolute: 0.6 10*3/uL (ref 0.1–1.0)
Monocytes Relative: 5 %
Neutro Abs: 7 10*3/uL (ref 1.7–7.7)
Neutrophils Relative %: 67 %
Platelets: 287 10*3/uL (ref 150–400)
RBC: 4.32 MIL/uL (ref 3.87–5.11)
RDW: 13.7 % (ref 11.5–15.5)
WBC: 10.5 10*3/uL (ref 4.0–10.5)
nRBC: 0 % (ref 0.0–0.2)

## 2019-05-12 LAB — LIPASE, BLOOD: Lipase: 21 U/L (ref 11–51)

## 2019-05-13 ENCOUNTER — Telehealth: Payer: Self-pay | Admitting: Emergency Medicine

## 2019-05-13 NOTE — Telephone Encounter (Signed)
Called patient due to lwot to inquire about condition and follow up plans. Left message.   

## 2020-03-08 DIAGNOSIS — R9431 Abnormal electrocardiogram [ECG] [EKG]: Secondary | ICD-10-CM | POA: Diagnosis not present

## 2020-03-08 DIAGNOSIS — U071 COVID-19: Secondary | ICD-10-CM | POA: Diagnosis not present

## 2020-03-08 DIAGNOSIS — Z79899 Other long term (current) drug therapy: Secondary | ICD-10-CM | POA: Diagnosis not present

## 2020-03-08 DIAGNOSIS — Z5941 Food insecurity: Secondary | ICD-10-CM | POA: Diagnosis not present

## 2020-03-08 DIAGNOSIS — I428 Other cardiomyopathies: Secondary | ICD-10-CM | POA: Diagnosis not present

## 2020-03-08 DIAGNOSIS — J45909 Unspecified asthma, uncomplicated: Secondary | ICD-10-CM | POA: Diagnosis not present

## 2020-03-08 DIAGNOSIS — I5022 Chronic systolic (congestive) heart failure: Secondary | ICD-10-CM | POA: Diagnosis not present

## 2020-03-08 DIAGNOSIS — F17210 Nicotine dependence, cigarettes, uncomplicated: Secondary | ICD-10-CM | POA: Diagnosis not present

## 2020-03-08 DIAGNOSIS — Z885 Allergy status to narcotic agent status: Secondary | ICD-10-CM | POA: Diagnosis not present

## 2021-12-20 DIAGNOSIS — Z419 Encounter for procedure for purposes other than remedying health state, unspecified: Secondary | ICD-10-CM | POA: Diagnosis not present

## 2022-01-20 DIAGNOSIS — Z419 Encounter for procedure for purposes other than remedying health state, unspecified: Secondary | ICD-10-CM | POA: Diagnosis not present

## 2022-02-20 DIAGNOSIS — Z419 Encounter for procedure for purposes other than remedying health state, unspecified: Secondary | ICD-10-CM | POA: Diagnosis not present

## 2022-02-24 ENCOUNTER — Telehealth: Payer: Self-pay

## 2022-02-24 NOTE — Telephone Encounter (Signed)
Mychart msg sent. AS, CMA 

## 2022-02-27 DIAGNOSIS — I5020 Unspecified systolic (congestive) heart failure: Secondary | ICD-10-CM | POA: Diagnosis not present

## 2022-02-27 DIAGNOSIS — I10 Essential (primary) hypertension: Secondary | ICD-10-CM | POA: Diagnosis not present

## 2022-03-21 DIAGNOSIS — Z419 Encounter for procedure for purposes other than remedying health state, unspecified: Secondary | ICD-10-CM | POA: Diagnosis not present
